# Patient Record
Sex: Female | Born: 1985 | Race: Black or African American | Hispanic: No | Marital: Married | Smoking: Never smoker
Health system: Southern US, Community
[De-identification: ages and names within clinical notes are randomized; demographics above are authoritative.]

## PROBLEM LIST (undated history)

## (undated) DIAGNOSIS — R51 Headache: Secondary | ICD-10-CM

## (undated) DIAGNOSIS — K219 Gastro-esophageal reflux disease without esophagitis: Secondary | ICD-10-CM

## (undated) DIAGNOSIS — A749 Chlamydial infection, unspecified: Secondary | ICD-10-CM

## (undated) HISTORY — PX: WISDOM TOOTH EXTRACTION: SHX21

---
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
## 2009-01-14 ENCOUNTER — Other Ambulatory Visit: Admission: RE | Admit: 2009-01-14 | Discharge: 2009-01-14 | Payer: Self-pay | Admitting: Family Medicine

## 2010-08-17 ENCOUNTER — Other Ambulatory Visit
Admission: RE | Admit: 2010-08-17 | Discharge: 2010-08-17 | Payer: Self-pay | Source: Home / Self Care | Admitting: Family Medicine

## 2012-08-20 NOTE — L&D Delivery Note (Addendum)
Delivery Note At 3:59 AM a viable and healthy female was delivered spontaneously, Left Occiput Anterior.  APGAR: 9, 9; weight 6 lbs 13 oz.   Placenta status: Intact, Spontaneous.  Cord: 3 vessels.  Uterus explored and cervix inspected.  Anesthesia: Epidural  Episiotomy: None Lacerations: None Est. Blood Loss (mL): <400  Mom to postpartum.  Baby to nursery-stable.  Emet Rafanan D 05/17/2013, 4:13 AM

## 2012-11-04 LAB — OB RESULTS CONSOLE HIV ANTIBODY (ROUTINE TESTING): HIV: NONREACTIVE

## 2012-11-04 LAB — OB RESULTS CONSOLE ANTIBODY SCREEN: Antibody Screen: NEGATIVE

## 2012-11-04 LAB — OB RESULTS CONSOLE HEPATITIS B SURFACE ANTIGEN: Hepatitis B Surface Ag: NEGATIVE

## 2012-11-04 LAB — OB RESULTS CONSOLE RPR: RPR: NONREACTIVE

## 2012-11-04 LAB — OB RESULTS CONSOLE ABO/RH: RH Type: POSITIVE

## 2012-11-04 LAB — OB RESULTS CONSOLE RUBELLA ANTIBODY, IGM: Rubella: IMMUNE

## 2013-04-16 LAB — OB RESULTS CONSOLE GC/CHLAMYDIA
Chlamydia: NEGATIVE
Gonorrhea: NEGATIVE

## 2013-04-16 LAB — OB RESULTS CONSOLE GBS
GBS: NEGATIVE
GBS: NEGATIVE

## 2013-05-16 ENCOUNTER — Inpatient Hospital Stay (HOSPITAL_COMMUNITY)
Admission: AD | Admit: 2013-05-16 | Discharge: 2013-05-16 | Disposition: A | Discharge status: Home / Self Care | Payer: Managed Care, Other (non HMO) | Source: Ambulatory Visit | Attending: Obstetrics & Gynecology | Admitting: Obstetrics & Gynecology

## 2013-05-16 ENCOUNTER — Encounter (HOSPITAL_COMMUNITY): Payer: Self-pay | Admitting: *Deleted

## 2013-05-16 ENCOUNTER — Encounter (HOSPITAL_COMMUNITY): Payer: Self-pay | Admitting: Anesthesiology

## 2013-05-16 ENCOUNTER — Inpatient Hospital Stay (HOSPITAL_COMMUNITY): Payer: Managed Care, Other (non HMO) | Admitting: Anesthesiology

## 2013-05-16 ENCOUNTER — Inpatient Hospital Stay (HOSPITAL_COMMUNITY)
Admission: AD | Admit: 2013-05-16 | Discharge: 2013-05-19 | DRG: 775 | Disposition: A | Discharge status: Home / Self Care | Payer: Managed Care, Other (non HMO) | Source: Ambulatory Visit | Attending: Obstetrics & Gynecology | Admitting: Obstetrics & Gynecology

## 2013-05-16 DIAGNOSIS — O479 False labor, unspecified: Secondary | ICD-10-CM | POA: Insufficient documentation

## 2013-05-16 HISTORY — DX: Headache: R51

## 2013-05-16 HISTORY — DX: Gastro-esophageal reflux disease without esophagitis: K21.9

## 2013-05-16 HISTORY — DX: Chlamydial infection, unspecified: A74.9

## 2013-05-16 LAB — CBC
HCT: 36.1 % (ref 36.0–46.0)
MCV: 81.1 fL (ref 78.0–100.0)
Platelets: 231 10*3/uL (ref 150–400)
RBC: 4.45 MIL/uL (ref 3.87–5.11)
RDW: 15 % (ref 11.5–15.5)
WBC: 9 10*3/uL (ref 4.0–10.5)

## 2013-05-16 MED ORDER — LACTATED RINGERS IV SOLN: 500.0000 mL | INTRAVENOUS | Status: DC | PRN

## 2013-05-16 MED ORDER — LIDOCAINE HCL (PF) 1 % IJ SOLN
INTRAMUSCULAR | Status: DC | PRN
  Administered 2013-05-16 (×3): 5 mL

## 2013-05-16 MED ORDER — PHENYLEPHRINE 40 MCG/ML (10ML) SYRINGE FOR IV PUSH (FOR BLOOD PRESSURE SUPPORT)
80.0000 ug | PREFILLED_SYRINGE | INTRAVENOUS | Status: DC | PRN
  Filled 2013-05-16: qty 2

## 2013-05-16 MED ORDER — FLEET ENEMA 7-19 GM/118ML RE ENEM: 1.0000 | ENEMA | RECTAL | Status: DC | PRN

## 2013-05-16 MED ORDER — EPHEDRINE 5 MG/ML INJ
10.0000 mg | INTRAVENOUS | Status: DC | PRN
  Filled 2013-05-16: qty 2
  Filled 2013-05-16: qty 4

## 2013-05-16 MED ORDER — OXYTOCIN 40 UNITS IN LACTATED RINGERS INFUSION - SIMPLE MED: 62.5000 mL/h | INTRAVENOUS | Status: DC

## 2013-05-16 MED ORDER — OXYCODONE-ACETAMINOPHEN 5-325 MG PO TABS: 1.0000 | ORAL_TABLET | ORAL | Status: DC | PRN

## 2013-05-16 MED ORDER — CITRIC ACID-SODIUM CITRATE 334-500 MG/5ML PO SOLN: 30.0000 mL | ORAL | Status: DC | PRN

## 2013-05-16 MED ORDER — OXYTOCIN 40 UNITS IN LACTATED RINGERS INFUSION - SIMPLE MED
1.0000 m[IU]/min | INTRAVENOUS | Status: DC
  Administered 2013-05-16: 2 m[IU]/min via INTRAVENOUS
  Filled 2013-05-16: qty 1000

## 2013-05-16 MED ORDER — FENTANYL 2.5 MCG/ML BUPIVACAINE 1/10 % EPIDURAL INFUSION (WH - ANES)
14.0000 mL/h | INTRAMUSCULAR | Status: DC | PRN
  Administered 2013-05-17: 14 mL/h via EPIDURAL
  Filled 2013-05-16 (×2): qty 125

## 2013-05-16 MED ORDER — IBUPROFEN 600 MG PO TABS
600.0000 mg | ORAL_TABLET | Freq: Four times a day (QID) | ORAL | Status: DC | PRN
  Administered 2013-05-17: 600 mg via ORAL
  Filled 2013-05-16: qty 1

## 2013-05-16 MED ORDER — ACETAMINOPHEN 325 MG PO TABS: 650.0000 mg | ORAL_TABLET | ORAL | Status: DC | PRN

## 2013-05-16 MED ORDER — FENTANYL 2.5 MCG/ML BUPIVACAINE 1/10 % EPIDURAL INFUSION (WH - ANES)
INTRAMUSCULAR | Status: DC | PRN
  Administered 2013-05-16: 14 mL/h via EPIDURAL

## 2013-05-16 MED ORDER — ONDANSETRON HCL 4 MG/2ML IJ SOLN
4.0000 mg | Freq: Four times a day (QID) | INTRAMUSCULAR | Status: DC | PRN
  Administered 2013-05-17: 4 mg via INTRAVENOUS
  Filled 2013-05-16: qty 2

## 2013-05-16 MED ORDER — PHENYLEPHRINE 40 MCG/ML (10ML) SYRINGE FOR IV PUSH (FOR BLOOD PRESSURE SUPPORT)
80.0000 ug | PREFILLED_SYRINGE | INTRAVENOUS | Status: DC | PRN
  Filled 2013-05-16: qty 2
  Filled 2013-05-16: qty 5

## 2013-05-16 MED ORDER — EPHEDRINE 5 MG/ML INJ
10.0000 mg | INTRAVENOUS | Status: DC | PRN
  Filled 2013-05-16: qty 2

## 2013-05-16 MED ORDER — LIDOCAINE HCL (PF) 1 % IJ SOLN
30.0000 mL | INTRAMUSCULAR | Status: DC | PRN
  Filled 2013-05-16 (×2): qty 30

## 2013-05-16 MED ORDER — TERBUTALINE SULFATE 1 MG/ML IJ SOLN: 0.2500 mg | Freq: Once | INTRAMUSCULAR | Status: AC | PRN

## 2013-05-16 MED ORDER — OXYTOCIN BOLUS FROM INFUSION
500.0000 mL | INTRAVENOUS | Status: DC
  Administered 2013-05-17: 500 mL via INTRAVENOUS

## 2013-05-16 MED ORDER — LACTATED RINGERS IV SOLN: INTRAVENOUS | Status: DC

## 2013-05-16 MED ORDER — LACTATED RINGERS IV SOLN
500.0000 mL | Freq: Once | INTRAVENOUS | Status: AC
  Administered 2013-05-16: 500 mL via INTRAVENOUS

## 2013-05-16 MED ORDER — DIPHENHYDRAMINE HCL 50 MG/ML IJ SOLN: 12.5000 mg | INTRAMUSCULAR | Status: DC | PRN

## 2013-05-16 NOTE — H&P (Signed)
27 y.o. G1P0  Estimated Date of Delivery: 05/17/13 admitted at 39/[redacted] weeks gestation in labor.  Prenatal Transfer Tool  Maternal Diabetes: No Genetic Screening: Normal Maternal Ultrasounds/Referrals: Normal Fetal Ultrasounds or other Referrals:  None Maternal Substance Abuse:  No Significant Maternal Medications:  None Significant Maternal Lab Results: None Other Significant Pregnancy Complications:  None  Afebrile, VSS Heart and Lungs: No active disease Abdomen: soft, gravid, EFW AGA. Cervical exam:  4/80  Impression: Early labor  Plan:  Admit for delivery

## 2013-05-16 NOTE — Anesthesia Procedure Notes (Signed)
Epidural Patient location during procedure: OB  Staffing Anesthesiologist: Tyheem Boughner Performed by: anesthesiologist   Preanesthetic Checklist Completed: patient identified, site marked, surgical consent, pre-op evaluation, timeout performed, IV checked, risks and benefits discussed and monitors and equipment checked  Epidural Patient position: sitting Prep: ChloraPrep Patient monitoring: heart rate, continuous pulse ox and blood pressure Approach: right paramedian Injection technique: LOR saline  Needle:  Needle type: Tuohy  Needle gauge: 17 G Needle length: 9 cm and 9 Needle insertion depth: 6 cm Catheter type: closed end flexible Catheter size: 20 Guage Catheter at skin depth: 12 cm Test dose: negative  Assessment Events: blood not aspirated, injection not painful, no injection resistance, negative IV test and no paresthesia  Additional Notes   Patient tolerated the insertion well without complications.   

## 2013-05-16 NOTE — MAU Note (Signed)
Contractions since 7pm every 4-6 minutes. Some vaginal discharge. Denies gushes of fluid and vaginal bleeding. Positive fetal movement.

## 2013-05-16 NOTE — Anesthesia Preprocedure Evaluation (Signed)

## 2013-05-17 ENCOUNTER — Encounter (HOSPITAL_COMMUNITY): Payer: Self-pay | Admitting: Obstetrics

## 2013-05-17 LAB — CBC
HCT: 28.9 % — ABNORMAL LOW (ref 36.0–46.0)
Hemoglobin: 9.8 g/dL — ABNORMAL LOW (ref 12.0–15.0)
MCH: 27.3 pg (ref 26.0–34.0)
MCV: 80.5 fL (ref 78.0–100.0)
Platelets: 182 10*3/uL (ref 150–400)
RDW: 15 % (ref 11.5–15.5)

## 2013-05-17 LAB — RPR: RPR Ser Ql: NONREACTIVE

## 2013-05-17 MED ORDER — DIBUCAINE 1 % RE OINT: 1.0000 "application " | TOPICAL_OINTMENT | RECTAL | Status: DC | PRN

## 2013-05-17 MED ORDER — SENNOSIDES-DOCUSATE SODIUM 8.6-50 MG PO TABS
2.0000 | ORAL_TABLET | Freq: Every day | ORAL | Status: DC
Start: 2013-05-18 — End: 2013-05-19
  Administered 2013-05-19: 2 via ORAL

## 2013-05-17 MED ORDER — ONDANSETRON HCL 4 MG PO TABS: 4.0000 mg | ORAL_TABLET | ORAL | Status: DC | PRN

## 2013-05-17 MED ORDER — INFLUENZA VAC SPLIT QUAD 0.5 ML IM SUSP
0.5000 mL | INTRAMUSCULAR | Status: AC
  Administered 2013-05-18: 0.5 mL via INTRAMUSCULAR
  Filled 2013-05-17: qty 0.5

## 2013-05-17 MED ORDER — ONDANSETRON HCL 4 MG/2ML IJ SOLN: 4.0000 mg | INTRAMUSCULAR | Status: DC | PRN

## 2013-05-17 MED ORDER — LANOLIN HYDROUS EX OINT: TOPICAL_OINTMENT | CUTANEOUS | Status: DC | PRN

## 2013-05-17 MED ORDER — BENZOCAINE-MENTHOL 20-0.5 % EX AERO: 1.0000 "application " | INHALATION_SPRAY | CUTANEOUS | Status: DC | PRN

## 2013-05-17 MED ORDER — DIPHENHYDRAMINE HCL 25 MG PO CAPS: 25.0000 mg | ORAL_CAPSULE | Freq: Four times a day (QID) | ORAL | Status: DC | PRN

## 2013-05-17 MED ORDER — PRENATAL MULTIVITAMIN CH
1.0000 | ORAL_TABLET | Freq: Every day | ORAL | Status: DC
  Administered 2013-05-17 – 2013-05-19 (×3): 1 via ORAL
  Filled 2013-05-17 (×3): qty 1

## 2013-05-17 MED ORDER — SIMETHICONE 80 MG PO CHEW: 80.0000 mg | CHEWABLE_TABLET | ORAL | Status: DC | PRN

## 2013-05-17 MED ORDER — ZOLPIDEM TARTRATE 5 MG PO TABS: 5.0000 mg | ORAL_TABLET | Freq: Every evening | ORAL | Status: DC | PRN

## 2013-05-17 MED ORDER — TETANUS-DIPHTH-ACELL PERTUSSIS 5-2.5-18.5 LF-MCG/0.5 IM SUSP
0.5000 mL | Freq: Once | INTRAMUSCULAR | Status: AC
  Administered 2013-05-18: 0.5 mL via INTRAMUSCULAR
  Filled 2013-05-17: qty 0.5

## 2013-05-17 MED ORDER — IBUPROFEN 600 MG PO TABS
600.0000 mg | ORAL_TABLET | Freq: Four times a day (QID) | ORAL | Status: DC
  Administered 2013-05-17 – 2013-05-19 (×9): 600 mg via ORAL
  Filled 2013-05-17 (×9): qty 1

## 2013-05-17 MED ORDER — WITCH HAZEL-GLYCERIN EX PADS: 1.0000 "application " | MEDICATED_PAD | CUTANEOUS | Status: DC | PRN

## 2013-05-17 MED ORDER — OXYCODONE-ACETAMINOPHEN 5-325 MG PO TABS: 1.0000 | ORAL_TABLET | ORAL | Status: DC | PRN

## 2013-05-17 NOTE — Anesthesia Postprocedure Evaluation (Signed)
Anesthesia Post Note  Patient: Angelica Rush  Procedure(s) Performed: * No procedures listed *  Anesthesia type: Epidural  Patient location: Mother/Baby  Post pain: Pain level controlled  Post assessment: Post-op Vital signs reviewed  Last Vitals:  Filed Vitals:   05/17/13 0816  BP: 111/52  Pulse: 97  Temp: 37.2 C  Resp: 18    Post vital signs: Reviewed  Level of consciousness:alert  Complications: No apparent anesthesia complications

## 2013-05-18 NOTE — Progress Notes (Signed)
PPD#1 Pt is doing well . Offered early discharge. VSSAF IMP/ stable PLAN/ routine care.

## 2013-05-19 NOTE — Discharge Summary (Signed)
Obstetric Discharge Summary Reason for Admission: onset of labor Prenatal Procedures: none Intrapartum Procedures: spontaneous vaginal delivery Postpartum Procedures: none Complications-Operative and Postpartum: none Hemoglobin  Date Value Range Status  05/17/2013 9.8* 12.0 - 15.0 g/dL Final     HCT  Date Value Range Status  05/17/2013 28.9* 36.0 - 46.0 % Final    Physical Exam:  General: alert and cooperative Lochia: appropriate Uterine Fundus: firm DVT Evaluation: No evidence of DVT seen on physical exam.  Discharge Diagnoses: Term Pregnancy-delivered  Discharge Information: Date: 05/19/2013 Activity: pelvic rest Diet: routine Medications: PNV and Ibuprofen Condition: stable Instructions: refer to practice specific booklet Discharge to: home Follow-up Information   Follow up with Mickel Baas, MD In 4 weeks.   Specialty:  Obstetrics and Gynecology   Contact information:   [address] [place] [address] [phone]       Newborn Data: Live born female  Birth Weight: 6 lb 13 oz (3090 g) APGAR: 9, 9  Home with mother.  Philip Aspen 05/19/2013, 10:05 AM

## 2014-06-21 ENCOUNTER — Encounter (HOSPITAL_COMMUNITY): Payer: Self-pay | Admitting: Obstetrics

## 2020-05-10 ENCOUNTER — Inpatient Hospital Stay (HOSPITAL_COMMUNITY)
Admission: AD | Admit: 2020-05-10 | Discharge: 2020-05-11 | Disposition: A | Discharge status: Home / Self Care | Payer: Federal, State, Local not specified - PPO | Attending: Family Medicine | Admitting: Family Medicine

## 2020-05-10 ENCOUNTER — Encounter (HOSPITAL_COMMUNITY): Payer: Self-pay | Admitting: Family Medicine

## 2020-05-10 ENCOUNTER — Other Ambulatory Visit: Payer: Self-pay

## 2020-05-10 DIAGNOSIS — O219 Vomiting of pregnancy, unspecified: Secondary | ICD-10-CM | POA: Diagnosis not present

## 2020-05-10 DIAGNOSIS — E860 Dehydration: Secondary | ICD-10-CM

## 2020-05-10 DIAGNOSIS — Z3A01 Less than 8 weeks gestation of pregnancy: Secondary | ICD-10-CM | POA: Diagnosis not present

## 2020-05-10 DIAGNOSIS — R112 Nausea with vomiting, unspecified: Secondary | ICD-10-CM | POA: Diagnosis not present

## 2020-05-10 DIAGNOSIS — K219 Gastro-esophageal reflux disease without esophagitis: Secondary | ICD-10-CM | POA: Insufficient documentation

## 2020-05-10 DIAGNOSIS — Z79899 Other long term (current) drug therapy: Secondary | ICD-10-CM | POA: Diagnosis not present

## 2020-05-10 DIAGNOSIS — Z7982 Long term (current) use of aspirin: Secondary | ICD-10-CM | POA: Insufficient documentation

## 2020-05-10 DIAGNOSIS — O99611 Diseases of the digestive system complicating pregnancy, first trimester: Secondary | ICD-10-CM | POA: Diagnosis not present

## 2020-05-10 DIAGNOSIS — O26891 Other specified pregnancy related conditions, first trimester: Secondary | ICD-10-CM | POA: Insufficient documentation

## 2020-05-10 LAB — COMPREHENSIVE METABOLIC PANEL
ALT: 16 U/L (ref 0–44)
AST: 24 U/L (ref 15–41)
Albumin: 4.2 g/dL (ref 3.5–5.0)
Alkaline Phosphatase: 41 U/L (ref 38–126)
Anion gap: 12 (ref 5–15)
BUN: 5 mg/dL — ABNORMAL LOW (ref 6–20)
CO2: 22 mmol/L (ref 22–32)
Calcium: 9.6 mg/dL (ref 8.9–10.3)
Chloride: 98 mmol/L (ref 98–111)
Creatinine, Ser: 0.71 mg/dL (ref 0.44–1.00)
GFR calc Af Amer: >60 mL/min (ref >60)
GFR calc non Af Amer: >60 mL/min (ref >60)
Glucose, Bld: 84 mg/dL (ref 70–99)
Potassium: 3.4 mmol/L — ABNORMAL LOW (ref 3.5–5.1)
Sodium: 132 mmol/L — ABNORMAL LOW (ref 135–145)
Total Bilirubin: 0.9 mg/dL (ref 0.3–1.2)
Total Protein: 7.9 g/dL (ref 6.5–8.1)

## 2020-05-10 LAB — CBC
HCT: 39.3 % (ref 36.0–46.0)
Hemoglobin: 12.1 g/dL (ref 12.0–15.0)
MCH: 23.5 pg — ABNORMAL LOW (ref 26.0–34.0)
MCHC: 30.8 g/dL (ref 30.0–36.0)
MCV: 76.5 fL — ABNORMAL LOW (ref 80.0–100.0)
Platelets: 421 10*3/uL — ABNORMAL HIGH (ref 150–400)
RBC: 5.14 MIL/uL — ABNORMAL HIGH (ref 3.87–5.11)
RDW: 16.7 % — ABNORMAL HIGH (ref 11.5–15.5)
WBC: 7.2 10*3/uL (ref 4.0–10.5)
nRBC: 0 % (ref 0.0–0.2)

## 2020-05-10 LAB — URINALYSIS, ROUTINE W REFLEX MICROSCOPIC
Bilirubin Urine: NEGATIVE
Glucose, UA: NEGATIVE mg/dL
Hgb urine dipstick: NEGATIVE
Ketones, ur: 80 mg/dL — AB
Nitrite: NEGATIVE
Protein, ur: NEGATIVE mg/dL
Specific Gravity, Urine: 1.02 (ref 1.005–1.030)
pH: 5 (ref 5.0–8.0)

## 2020-05-10 LAB — POCT PREGNANCY, URINE: Preg Test, Ur: POSITIVE — AB

## 2020-05-10 MED ORDER — DIPHENHYDRAMINE HCL 25 MG PO CAPS
50.0000 mg | ORAL_CAPSULE | Freq: Once | ORAL | Status: AC
  Administered 2020-05-10: 50 mg via ORAL
  Filled 2020-05-10: qty 2

## 2020-05-10 MED ORDER — SCOPOLAMINE 1 MG/3DAYS TD PT72: 1.0000 | MEDICATED_PATCH | TRANSDERMAL | 12 refills | Status: AC

## 2020-05-10 MED ORDER — M.V.I. ADULT IV INJ
INJECTION | Freq: Once | INTRAVENOUS | Status: AC
  Filled 2020-05-10: qty 1000

## 2020-05-10 MED ORDER — SCOPOLAMINE 1 MG/3DAYS TD PT72
1.0000 | MEDICATED_PATCH | TRANSDERMAL | Status: DC
Start: 2020-05-10 — End: 2020-05-11
  Administered 2020-05-10: 1.5 mg via TRANSDERMAL
  Filled 2020-05-10: qty 1

## 2020-05-10 MED ORDER — PROMETHAZINE HCL 25 MG/ML IJ SOLN
25.0000 mg | Freq: Once | INTRAVENOUS | Status: AC
  Administered 2020-05-10: 22:00:00 25 mg via INTRAVENOUS
  Filled 2020-05-10: qty 1

## 2020-05-10 NOTE — MAU Note (Signed)
PT SAYS VOMITING - X3 TODAY . CALLED [facility] TODAY - HAS AN APPOINTMENT ON Monday . THEY CALLED IN PHENERGAN - PT TOOK AND VOMITED.

## 2020-05-10 NOTE — MAU Provider Note (Addendum)
History     CSN: [id number]  Arrival date and time: 05/10/20 1805   Saw patient at 2050    Chief Complaint  Patient presents with   Emesis   Angelica Rush 34 y.o. [redacted]w[redacted]d  Has had several days of nausea and vomiting.  Has new OB appointment on Monday.  Office has called in promethazine PO but client keeps vomiting the medicine again.  Has tried vitamin B^ and Unisom but keeps vomiting it as well.  Only able to keep down 1 cup of fluids a couple of times a day and a couple of crackers.  Emesis  This is a recurrent problem. The problem has been unchanged. There has been no fever. Pertinent negatives include no abdominal pain, coughing, diarrhea or fever. Treatments tried: Diclegis, phenergan. The treatment provided no relief.    OB History    Gravida  4   Para  1   Term  1   Preterm      AB  2   Living  1     SAB      TAB  2   Ectopic      Multiple      Live Births  1           Past Medical History:  Diagnosis Date   Chlamydia    GERD (gastroesophageal reflux disease)    Headache(784.0)     Past Surgical History:  Procedure Laterality Date   WISDOM TOOTH EXTRACTION      Family History  Problem Relation Age of Onset   Hypertension Mother    Hypertension Father    Cancer Maternal Grandmother        Breast    Social History   Tobacco Use   Smoking status: Never Smoker   Smokeless tobacco: Never Used  Vaping Use   Vaping Use: Never used  Substance Use Topics   Alcohol use: No   Drug use: No    Allergies:  Allergies  Allergen Reactions   Latex Itching    Medications Prior to Admission  Medication Sig Dispense Refill Last Dose   Prenatal Vit-Fe Fumarate-FA (MULTIVITAMIN-PRENATAL) 27-0.8 MG TABS tablet Take 1 tablet by mouth daily at 12 noon.   Past Week at Unknown time   promethazine (PHENERGAN) 25 MG tablet Take 25 mg by mouth every 6 (six) hours as needed for nausea or vomiting.   05/10/2020 at 1200    butalbital-aspirin-caffeine-codeine (FIORINAL WITH CODEINE) 50-325-40-30 MG capsule Take 1 capsule by mouth every 4 (four) hours as needed for pain.      omeprazole (PRILOSEC) 20 MG capsule Take 20 mg by mouth daily as needed (heartburn).       Review of Systems  Constitutional: Negative for fever.  Respiratory: Negative for cough, shortness of breath and wheezing.   Gastrointestinal: Positive for nausea and vomiting. Negative for abdominal pain and diarrhea.  Genitourinary: Negative for dysuria, vaginal bleeding and vaginal discharge.   Physical Exam   Blood pressure 121/84, pulse 82, temperature 99.1 F (37.3 C), resp. rate 20, height 5\' 4"  (1.626 m), weight 92.8 kg, last menstrual period 03/17/2020, unknown if currently breastfeeding.  Physical Exam Vitals and nursing note reviewed.  Constitutional:      Appearance: She is well-developed.  HENT:     Head: Normocephalic.  Abdominal:     Palpations: Abdomen is soft.     Tenderness: There is no abdominal tenderness. There is no guarding or rebound.  Musculoskeletal:  General: Normal range of motion.     Cervical back: Neck supple.  Skin:    General: Skin is warm and dry.  Neurological:     Mental Status: She is alert and oriented to person, place, and time.     MAU Course  Procedures  MDM Will start Phenergan 25 mg in 1000 cc of LR and follow with 1000cc D5 LR with multivitamins. Discussed options for taking medication - Phenergan tablet in the vagina.  Wait 30 minutes and try PO liquids or soft solids.  Discussed starting slowly with liquids and advancing to more solid foods slowly.  Avoid any oils and fried foods. Client had a reaction to Phenergan infusion - feeling tingling all over and cannot be still - standing and constantly moving her feet and legs.  Tired but not sleepy.  Nausea resolved.  Offered Benadryl PO to help reverse this side effect.  Client agrees and benadryl ordered, however client vomited 40  minutes after taking Benadryl PO.   Getting bag of multivitamins and is tolerating that bag. After vomiting, Scopolamine patch in place, but still having some nausea - offered Zofran IV - reviewed caution about using Zofran in first trimester due to animal studies showing possible defects associated with use.  Explained that no human studies have shown defects, but client declines Zofran at this time. Will give a small dose of Benadryl IV to replace some of what was possibly lost by vomiting as client still continues to have dystonic reaction.  Able to lie in bed some but is again standing beside the bed to help control her jerky movements.   Assessment and Plan  Nausea and vomiting in pregnancy. Dystonic reaction to Phenergan   Angelica Rush 05/10/2020, 8:59 PM   0025 Care assumed by Angelica Rush, CNM After a second dose of IV Benadryl, dystonic movements improved Plan Discharge home Will send Rx for zofran for nausea.  Explained small risk of defects on animal studies which need to be balanced with need to be able to eat and drink. Will also add Vitamin B6 and scopolamine patch.  Angelica Rush, CNM

## 2020-05-11 MED ORDER — VITAMIN B-6 100 MG PO TABS: 100.0000 mg | ORAL_TABLET | Freq: Every day | ORAL | 1 refills | Status: AC

## 2020-05-11 MED ORDER — DIPHENHYDRAMINE HCL 50 MG/ML IJ SOLN
25.0000 mg | Freq: Once | INTRAMUSCULAR | Status: AC
Start: 2020-05-11 — End: 2020-05-11
  Administered 2020-05-11: 01:00:00 25 mg via INTRAVENOUS
  Filled 2020-05-11: qty 1

## 2020-05-11 MED ORDER — ONDANSETRON 4 MG PO TBDP: 4.0000 mg | ORAL_TABLET | Freq: Four times a day (QID) | ORAL | 0 refills | Status: DC | PRN

## 2020-05-11 NOTE — Discharge Instructions (Signed)
Hyperemesis Gravidarum Hyperemesis gravidarum is a severe form of nausea and vomiting that happens during pregnancy. Hyperemesis is worse than morning sickness. It may cause you to have nausea or vomiting all day for many days. It may keep you from eating and drinking enough food and liquids, which can lead to dehydration, malnutrition, and weight loss. Hyperemesis usually occurs during the first half (the first 20 weeks) of pregnancy. It often goes away once a woman is in her second half of pregnancy. However, sometimes hyperemesis continues through an entire pregnancy. What are the causes? The cause of this condition is not known. It may be related to changes in chemicals (hormones) in the body during pregnancy, such as the high level of pregnancy hormone (human chorionic gonadotropin) or the increase in the female sex hormone (estrogen). What are the signs or symptoms? Symptoms of this condition include:  Nausea that does not go away.  Vomiting that does not allow you to keep any food down.  Weight loss.  Body fluid loss (dehydration).  Having no desire to eat, or not liking food that you have previously enjoyed. How is this diagnosed? This condition may be diagnosed based on:  A physical exam.  Your medical history.  Your symptoms.  Blood tests.  Urine tests. How is this treated? This condition is managed by controlling symptoms. This may include:  Following an eating plan. This can help lessen nausea and vomiting.  Taking prescription medicines. An eating plan and medicines are often used together to help control symptoms. If medicines do not help relieve nausea and vomiting, you may need to receive fluids through an IV at the hospital. Follow these instructions at home: Eating and drinking   Avoid the following: ? Drinking fluids with meals. Try not to drink anything during the 30 minutes before and after your meals. ? Drinking more than 1 cup of fluid at a  time. ? Eating foods that trigger your symptoms. These may include spicy foods, coffee, high-fat foods, very sweet foods, and acidic foods. ? Skipping meals. Nausea can be more intense on an empty stomach. If you cannot tolerate food, do not force it. Try sucking on ice chips or other frozen items and make up for missed calories later. ? Lying down within 2 hours after eating. ? Being exposed to environmental triggers. These may include food smells, smoky rooms, closed spaces, rooms with strong smells, warm or humid places, overly loud and noisy rooms, and rooms with motion or flickering lights. Try eating meals in a well-ventilated area that is free of strong smells. ? Quick and sudden changes in your movement. ? Taking iron pills and multivitamins that contain iron. If you take prescription iron pills, do not stop taking them unless your health care provider approves. ? Preparing food. The smell of food can spoil your appetite or trigger nausea.  To help relieve your symptoms: ? Listen to your body. Everyone is different and has different preferences. Find what works best for you. ? Eat and drink slowly. ? Eat 5-6 small meals daily instead of 3 large meals. Eating small meals and snacks can help you avoid an empty stomach. ? In the morning, before getting out of bed, eat a couple of crackers to avoid moving around on an empty stomach. ? Try eating starchy foods as these are usually tolerated well. Examples include cereal, toast, bread, potatoes, pasta, rice, and pretzels. ? Include at least 1 serving of protein with your meals and snacks. Protein options include   lean meats, poultry, seafood, beans, nuts, nut butters, eggs, cheese, and yogurt. ? Try eating a protein-rich snack before bed. Examples of a protein-rick snack include cheese and crackers or a peanut butter sandwich made with 1 slice of whole-wheat bread and 1 tsp (5 g) of peanut butter. ? Eat or suck on things that have ginger in them.  It may help relieve nausea. Add  tsp ground ginger to hot tea or choose ginger tea. ? Try drinking 100% fruit juice or an electrolyte drink. An electrolyte drink contains sodium, potassium, and chloride. ? Drink fluids that are cold, clear, and carbonated or sour. Examples include lemonade, ginger ale, lemon-lime soda, ice water, and sparkling water. ? Brush your teeth or use a mouth rinse after meals. ? Talk with your health care provider about starting a supplement of vitamin B6. General instructions  Take over-the-counter and prescription medicines only as told by your health care provider.  Follow instructions from your health care provider about eating or drinking restrictions.  Continue to take your prenatal vitamins as told by your health care provider. If you are having trouble taking your prenatal vitamins, talk with your health care provider about different options.  Keep all follow-up and pre-birth (prenatal) visits as told by your health care provider. This is important. Contact a health care provider if:  You have pain in your abdomen.  You have a severe headache.  You have vision problems.  You are losing weight.  You feel weak or dizzy. Get help right away if:  You cannot drink fluids without vomiting.  You vomit blood.  You have constant nausea and vomiting.  You are very weak.  You faint.  You have a fever and your symptoms suddenly get worse. Summary  Hyperemesis gravidarum is a severe form of nausea and vomiting that happens during pregnancy.  Making some changes to your eating habits may help relieve nausea and vomiting.  This condition may be managed with medicine.  If medicines do not help relieve nausea and vomiting, you may need to receive fluids through an IV at the hospital. This information is not intended to replace advice given to you by your health care provider. Make sure you discuss any questions you have with your health care  provider. Document Revised: 08/26/2017 Document Reviewed: 04/04/2016 Elsevier Patient Education  2020 [employer]. Keep your appointment in the office. Try the scopolamine patch instead of using phenergan. Try to also take the vitamin B6 and Unisom at bedtime.  [facility] [employer] for [employer] at [facility]       Phone: [phone]  Center for [employer] at [place]   Phone: [phone]  Center for [employer] at [place]  Phone: [phone]  Center for [facility] at [employer]  Phone: [phone]  Center for [facility] at [place]  Phone: [phone]  [facility] at [facility]   Phone: [phone]  [facility]       Phone: [phone]  [facility]    Phone: [phone]   [facility]    Phone: [phone]  [facility]    Phone: [phone]  [facility]    Phone: [phone]  [facility] Department-Family Planning       Phone: [phone]   [facility] Health Department-Maternity  Phone: [phone]  [facility]    Phone: [phone]  [facility] [place]  Phone: [phone]  [facility]      Phone: [phone]  [facility]    Phone: [phone]

## 2023-03-18 ENCOUNTER — Ambulatory Visit
Admission: EM | Admit: 2023-03-18 | Discharge: 2023-03-18 | Disposition: A | Discharge status: Home / Self Care | Payer: Federal, State, Local not specified - PPO | Attending: Internal Medicine | Admitting: Internal Medicine

## 2023-03-18 DIAGNOSIS — Z1152 Encounter for screening for COVID-19: Secondary | ICD-10-CM | POA: Diagnosis not present

## 2023-03-18 DIAGNOSIS — R110 Nausea: Secondary | ICD-10-CM | POA: Diagnosis not present

## 2023-03-18 DIAGNOSIS — R059 Cough, unspecified: Secondary | ICD-10-CM | POA: Insufficient documentation

## 2023-03-18 DIAGNOSIS — B9789 Other viral agents as the cause of diseases classified elsewhere: Secondary | ICD-10-CM | POA: Insufficient documentation

## 2023-03-18 DIAGNOSIS — J069 Acute upper respiratory infection, unspecified: Secondary | ICD-10-CM | POA: Insufficient documentation

## 2023-03-18 MED ORDER — ONDANSETRON 4 MG PO TBDP
4.0000 mg | ORAL_TABLET | Freq: Once | ORAL | Status: AC
  Administered 2023-03-18: 4 mg via ORAL

## 2023-03-18 MED ORDER — ONDANSETRON 4 MG PO TBDP: 4.0000 mg | ORAL_TABLET | Freq: Three times a day (TID) | ORAL | 0 refills | Status: DC | PRN

## 2023-03-18 MED ORDER — BENZONATATE 100 MG PO CAPS: 100.0000 mg | ORAL_CAPSULE | Freq: Three times a day (TID) | ORAL | 0 refills | Status: AC

## 2023-03-18 MED ORDER — KETOROLAC TROMETHAMINE 30 MG/ML IJ SOLN
30.0000 mg | Freq: Once | INTRAMUSCULAR | Status: AC
  Administered 2023-03-18: 30 mg via INTRAMUSCULAR

## 2023-03-18 NOTE — ED Provider Notes (Signed)
[facility]    CSN: [id number] Arrival date & time: 03/18/23  1548      History   Chief Complaint Chief Complaint  Patient presents with   Cough    HPI Angelica Rush is a 37 y.o. female.   Patient presents to urgent care for evaluation of cough, body aches, fatigue, and headache that started 2 days ago on Saturday March 16, 2023. Cough is mostly dry and non-productive. Reports some bilateral chest discomfort associated with coughing, no chest pain at rest. Reports post-nasal drip sensation. Headache is generalized to the frontal aspect of the head and is currently 2/10, comes and goes. Reports intermittent nausea without vomiting, diarrhea, rash, abdominal pain, or dizziness. Never smoker, denies drug use.  Denies history of chronic respiratory problems. Using OTC dayquil/nyquil as needed for symptoms with temporary relief.   Cough   Past Medical History:  Diagnosis Date   Chlamydia    GERD (gastroesophageal reflux disease)    Headache(784.0)     There are no problems to display for this patient.   Past Surgical History:  Procedure Laterality Date   WISDOM TOOTH EXTRACTION      OB History     Gravida  4   Para  1   Term  1   Preterm      AB  2   Living  1      SAB      IAB  2   Ectopic      Multiple      Live Births  1            Home Medications    Prior to Admission medications   Medication Sig Start Date End Date Taking? Authorizing Provider  benzonatate (TESSALON) 100 MG capsule Take 1 capsule (100 mg total) by mouth every 8 (eight) hours. 03/18/23  Yes Etter Royall, Donavan Burnet, FNP  HAILEY 24 FE 1-20 MG-MCG(24) tablet Take 1 tablet by mouth daily. 03/07/23  Yes [provider]  ondansetron (ZOFRAN-ODT) 4 MG disintegrating tablet Take 1 tablet (4 mg total) by mouth every 8 (eight) hours as needed for nausea or vomiting. 03/18/23  Yes Carlisle Beers, FNP  omeprazole (PRILOSEC) 20 MG capsule Take 20 mg by mouth  daily as needed (heartburn).    [provider]  Prenatal Vit-Fe Fumarate-FA (MULTIVITAMIN-PRENATAL) 27-0.8 MG TABS tablet Take 1 tablet by mouth daily at 12 noon.    [provider]  pyridOXINE (VITAMIN B-6) 100 MG tablet Take 1 tablet (100 mg total) by mouth daily. 05/11/20   Aviva Signs, CNM  scopolamine (TRANSDERM-SCOP) 1 MG/3DAYS Place 1 patch (1.5 mg total) onto the skin every 3 (three) days. 05/10/20   Currie Paris, NP    Family History Family History  Problem Relation Age of Onset   Hypertension Mother    Hypertension Father    Cancer Maternal Grandmother        Breast    Social History Social History   Tobacco Use   Smoking status: Never   Smokeless tobacco: Never  Vaping Use   Vaping status: Never Used  Substance Use Topics   Alcohol use: Yes    Comment: occ   Drug use: Yes    Types: Marijuana    Comment: edibles     Allergies   Phenergan [promethazine] and Latex   Review of Systems Review of Systems  Respiratory:  Positive for cough.   Per HPI   Physical Exam Triage Vital  Signs ED Triage Vitals  Encounter Vitals Group     BP 03/18/23 1558 130/88     Systolic BP Percentile --      Diastolic BP Percentile --      Pulse Rate 03/18/23 1558 99     Resp 03/18/23 1558 18     Temp 03/18/23 1558 99.5 F (37.5 C)     Temp Source 03/18/23 1558 Oral     SpO2 03/18/23 1558 97 %     Weight --      Height --      Head Circumference --      Peak Flow --      Pain Score 03/18/23 1606 0     Pain Loc --      Pain Education --      Exclude from Growth Chart --    No data found.  Updated Vital Signs BP 130/88 (BP Location: Right Arm)   Pulse 99   Temp 99.5 F (37.5 C) (Oral)   Resp 18   SpO2 97%   Visual Acuity Right Eye Distance:   Left Eye Distance:   Bilateral Distance:    Right Eye Near:   Left Eye Near:    Bilateral Near:     Physical Exam Vitals and nursing note reviewed.  Constitutional:      Appearance: She  is obese. She is ill-appearing. She is not toxic-appearing.  HENT:     Head: Normocephalic and atraumatic.     Right Ear: Hearing, tympanic membrane, ear canal and external ear normal.     Left Ear: Hearing, tympanic membrane, ear canal and external ear normal.     Nose: Congestion present.     Mouth/Throat:     Lips: Pink.     Mouth: Mucous membranes are moist. No injury.     Tongue: No lesions. Tongue does not deviate from midline.     Palate: No mass and lesions.     Pharynx: Oropharynx is clear. Uvula midline. Posterior oropharyngeal erythema present. No pharyngeal swelling, oropharyngeal exudate or uvula swelling.     Tonsils: No tonsillar exudate or tonsillar abscesses.  Eyes:     General: Lids are normal. Vision grossly intact. Gaze aligned appropriately.     Extraocular Movements: Extraocular movements intact.     Conjunctiva/sclera: Conjunctivae normal.  Cardiovascular:     Rate and Rhythm: Normal rate and regular rhythm.     Heart sounds: Normal heart sounds, S1 normal and S2 normal.  Pulmonary:     Effort: Pulmonary effort is normal. No respiratory distress.     Breath sounds: Normal breath sounds and air entry. No wheezing, rhonchi or rales.  Chest:     Chest wall: No tenderness.  Musculoskeletal:     Cervical back: Neck supple.  Lymphadenopathy:     Cervical: No cervical adenopathy.  Skin:    General: Skin is warm and dry.     Capillary Refill: Capillary refill takes less than 2 seconds.     Findings: No rash.  Neurological:     General: No focal deficit present.     Mental Status: She is alert and oriented to person, place, and time. Mental status is at baseline.     Cranial Nerves: No dysarthria or facial asymmetry.  Psychiatric:        Mood and Affect: Mood normal.        Speech: Speech normal.        Behavior: Behavior normal.  Thought Content: Thought content normal.        Judgment: Judgment normal.      UC Treatments / Results  Labs (all labs  ordered are listed, but only abnormal results are displayed) Labs Reviewed  SARS CORONAVIRUS 2 (TAT 6-24 HRS)    EKG   Radiology No results found.  Procedures Procedures (including critical care time)  Medications Ordered in UC Medications  ondansetron (ZOFRAN-ODT) disintegrating tablet 4 mg (has no administration in time range)  ketorolac (TORADOL) 30 MG/ML injection 30 mg (has no administration in time range)    Initial Impression / Assessment and Plan / UC Course  I have reviewed the triage vital signs and the nursing notes.  Pertinent labs & imaging results that were available during my care of the patient were reviewed by me and considered in my medical decision making (see chart for details).   1. Viral URI with cough, nausea without vomiting Evaluation suggests viral URI etiology. Will manage this with recommendations for OTC and prescription medications for symptomatic relief. Encouraged to push fluids to stay well hydrated.  Imaging: deferred based on stable cardiopulmonary exam/hemodynamically stable vital signs  Viral testing: COVID-19 testing is pending, patient is a candidate for antiviral therapy and may have Paxlovid if test positive for COVID and within the 5-day window based on history of obesity. Last CMP 04/2020 shows creatinine 0.71 and GFR greater than 60.   Medications given in clinic: Ketorolac 30mg  IM, zofran 4mg  ODT (no NSAID for 24 hours)  Counseled patient on potential for adverse effects with medications prescribed/recommended today, strict ER and return-to-clinic precautions discussed, patient verbalized understanding.    Final Clinical Impressions(s) / UC Diagnoses   Final diagnoses:  Viral URI with cough  Nausea without vomiting     Discharge Instructions      Your symptoms are most likely due to a viral illness, which will improve on its own with rest and fluids.  - Take prescribed medicines to help with symptoms: tessalon perles for  cough and zofran for nausea - Use over the counter medicines to help with symptoms as discussed: Ibuprofen, tylenol, guaifenesin (mucinex), zyrtec, etc - Two teaspoons of honey in warm water every 4-6 hours may help with throat pains - Humidifier in your room at night to help add water the air and soothe cough  If you develop any new or worsening symptoms or do not improve in the next 2 to 3 days, please return.  If your symptoms are severe, please go to the emergency room.  Follow-up with PCP as needed.     ED Prescriptions     Medication Sig Dispense Auth. Provider   benzonatate (TESSALON) 100 MG capsule Take 1 capsule (100 mg total) by mouth every 8 (eight) hours. 21 capsule Reita May M, FNP   ondansetron (ZOFRAN-ODT) 4 MG disintegrating tablet Take 1 tablet (4 mg total) by mouth every 8 (eight) hours as needed for nausea or vomiting. 20 tablet Carlisle Beers, FNP      PDMP not reviewed this encounter.   Carlisle Beers, [place] 03/18/23 [phone]

## 2023-03-18 NOTE — ED Triage Notes (Signed)
Pt c/o prod cough, head/chest congestion, chest tightness- sx started 2 days ago after travel-NAD-steady gait

## 2023-03-18 NOTE — Discharge Instructions (Addendum)
Your symptoms are most likely due to a viral illness, which will improve on its own with rest and fluids.  - Take prescribed medicines to help with symptoms: tessalon perles for cough and zofran for nausea - Use over the counter medicines to help with symptoms as discussed: Ibuprofen, tylenol, guaifenesin (mucinex), zyrtec, etc - Two teaspoons of honey in warm water every 4-6 hours may help with throat pains - Humidifier in your room at night to help add water the air and soothe cough  If you develop any new or worsening symptoms or do not improve in the next 2 to 3 days, please return.  If your symptoms are severe, please go to the emergency room.  Follow-up with PCP as needed.

## 2024-07-07 ENCOUNTER — Other Ambulatory Visit: Payer: Self-pay

## 2024-07-07 ENCOUNTER — Encounter (HOSPITAL_COMMUNITY): Payer: Self-pay

## 2024-07-07 ENCOUNTER — Emergency Department (HOSPITAL_COMMUNITY)
Admission: EM | Admit: 2024-07-07 | Discharge: 2024-07-07 | Disposition: A | Discharge status: Home / Self Care | Attending: Emergency Medicine | Admitting: Emergency Medicine

## 2024-07-07 DIAGNOSIS — Z91040 Latex allergy status: Secondary | ICD-10-CM | POA: Insufficient documentation

## 2024-07-07 DIAGNOSIS — R112 Nausea with vomiting, unspecified: Secondary | ICD-10-CM | POA: Insufficient documentation

## 2024-07-07 LAB — CBC
HCT: 32.2 % — ABNORMAL LOW (ref 36.0–46.0)
Hemoglobin: 9.7 g/dL — ABNORMAL LOW (ref 12.0–15.0)
MCH: 22.6 pg — ABNORMAL LOW (ref 26.0–34.0)
MCHC: 30.1 g/dL (ref 30.0–36.0)
MCV: 74.9 fL — ABNORMAL LOW (ref 80.0–100.0)
Platelets: 406 K/uL — ABNORMAL HIGH (ref 150–400)
RBC: 4.3 MIL/uL (ref 3.87–5.11)
RDW: 20.4 % — ABNORMAL HIGH (ref 11.5–15.5)
WBC: 8.5 K/uL (ref 4.0–10.5)
nRBC: 0 % (ref 0.0–0.2)

## 2024-07-07 LAB — COMPREHENSIVE METABOLIC PANEL WITH GFR
ALT: 17 U/L (ref 0–44)
AST: 15 U/L (ref 15–41)
Albumin: 3.5 g/dL (ref 3.5–5.0)
Alkaline Phosphatase: 47 U/L (ref 38–126)
Anion gap: 10 (ref 5–15)
BUN: 10 mg/dL (ref 6–20)
CO2: 22 mmol/L (ref 22–32)
Calcium: 8.6 mg/dL — ABNORMAL LOW (ref 8.9–10.3)
Chloride: 104 mmol/L (ref 98–111)
Creatinine, Ser: 0.65 mg/dL (ref 0.44–1.00)
GFR, Estimated: >60 mL/min (ref >60)
Glucose, Bld: 114 mg/dL — ABNORMAL HIGH (ref 70–99)
Potassium: 4.1 mmol/L (ref 3.5–5.1)
Sodium: 136 mmol/L (ref 135–145)
Total Bilirubin: 0.4 mg/dL (ref 0.0–1.2)
Total Protein: 6.7 g/dL (ref 6.5–8.1)

## 2024-07-07 LAB — URINALYSIS, ROUTINE W REFLEX MICROSCOPIC
Bilirubin Urine: NEGATIVE
Glucose, UA: NEGATIVE mg/dL
Hgb urine dipstick: NEGATIVE
Ketones, ur: NEGATIVE mg/dL
Leukocytes,Ua: NEGATIVE
Nitrite: NEGATIVE
Protein, ur: NEGATIVE mg/dL
Specific Gravity, Urine: 1.024 (ref 1.005–1.030)
pH: 6 (ref 5.0–8.0)

## 2024-07-07 LAB — HCG, SERUM, QUALITATIVE: Preg, Serum: NEGATIVE

## 2024-07-07 LAB — LIPASE, BLOOD: Lipase: 33 U/L (ref 11–51)

## 2024-07-07 MED ORDER — ONDANSETRON 4 MG PO TBDP: 4.0000 mg | ORAL_TABLET | Freq: Three times a day (TID) | ORAL | 0 refills | Status: AC | PRN

## 2024-07-07 MED ORDER — ONDANSETRON 4 MG PO TBDP
4.0000 mg | ORAL_TABLET | Freq: Once | ORAL | Status: AC
  Administered 2024-07-07: 4 mg via ORAL
  Filled 2024-07-07: qty 1

## 2024-07-07 NOTE — ED Triage Notes (Signed)
 Pt arrived from home via POV c/o abd cramping 3/10 with emesis that began at 0100. Pt states that she had x 1 emesis episode. Pt states that it was brown in color and was worried that she might be bleeding internally.

## 2024-07-07 NOTE — ED Notes (Signed)
Pt tolerating po fluids well

## 2024-07-07 NOTE — ED Provider Notes (Signed)
 [place] EMERGENCY DEPARTMENT AT [facility] Provider Note   CSN: [id number] Arrival date & time: 07/07/24  0255     Patient presents with: Emesis and Abdominal Pain   MASYN FULLAM is a 38 y.o. female.    Emesis Associated symptoms: abdominal pain   Abdominal Pain Associated symptoms: vomiting    Patient is a 38 year old female with no pertinent past medical history presented emergency room today with complaints of 1 episode of emesis that occurred this morning patient states that she has a nausea is worse in general she denies any abdominal pain or vomiting since this occurred.  She denies any vaginal bleeding no chest pain difficulty breathing no headache or head injury.      Prior to Admission medications   Medication Sig Start Date End Date Taking? Authorizing Provider  ondansetron  (ZOFRAN -ODT) 4 MG disintegrating tablet Take 1 tablet (4 mg total) by mouth every 8 (eight) hours as needed for nausea or vomiting. 07/07/24  Yes Alizey Noren S, PA  benzonatate  (TESSALON ) 100 MG capsule Take 1 capsule (100 mg total) by mouth every 8 (eight) hours. 03/18/23   Enedelia Dorna HERO, FNP  HAILEY 24 FE 1-20 MG-MCG(24) tablet Take 1 tablet by mouth daily. 03/07/23   [provider]  omeprazole (PRILOSEC) 20 MG capsule Take 20 mg by mouth daily as needed (heartburn).    [provider]  Prenatal Vit-Fe Fumarate-FA (MULTIVITAMIN-PRENATAL) 27-0.8 MG TABS tablet Take 1 tablet by mouth daily at 12 noon.    [provider]  pyridOXINE (VITAMIN B-6) 100 MG tablet Take 1 tablet (100 mg total) by mouth daily. 05/11/20   Trudy Earnie CROME, CNM  scopolamine  (TRANSDERM-SCOP) 1 MG/3DAYS Place 1 patch (1.5 mg total) onto the skin every 3 (three) days. 05/10/20   Sid Veva CROME, NP    Allergies: Phenergan  [promethazine ] and Latex    Review of Systems  Gastrointestinal:  Positive for abdominal pain and vomiting.    Updated Vital Signs BP 119/77 (BP  Location: Left Arm)   Pulse 99   Temp 98 F (36.7 C)   Resp 18   Ht 5' 3.5 (1.613 m)   Wt 104.3 kg   LMP 06/16/2024 (Approximate)   SpO2 100%   BMI 40.10 kg/m   Physical Exam Vitals and nursing note reviewed.  Constitutional:      General: She is not in acute distress. HENT:     Head: Normocephalic and atraumatic.     Nose: Nose normal.  Eyes:     General: No scleral icterus. Cardiovascular:     Rate and Rhythm: Normal rate and regular rhythm.     Pulses: Normal pulses.     Heart sounds: Normal heart sounds.  Pulmonary:     Effort: Pulmonary effort is normal. No respiratory distress.     Breath sounds: No wheezing.  Abdominal:     Palpations: Abdomen is soft.     Tenderness: There is no abdominal tenderness.  Musculoskeletal:     Cervical back: Normal range of motion.     Right lower leg: No edema.     Left lower leg: No edema.  Skin:    General: Skin is warm and dry.     Capillary Refill: Capillary refill takes less than 2 seconds.  Neurological:     Mental Status: She is alert. Mental status is at baseline.  Psychiatric:        Mood and Affect: Mood normal.  Behavior: Behavior normal.     (all labs ordered are listed, but only abnormal results are displayed) Labs Reviewed  COMPREHENSIVE METABOLIC PANEL WITH GFR - Abnormal; Notable for the following components:      Result Value   Glucose, Bld 114 (*)    Calcium 8.6 (*)    All other components within normal limits  CBC - Abnormal; Notable for the following components:   Hemoglobin 9.7 (*)    HCT 32.2 (*)    MCV 74.9 (*)    MCH 22.6 (*)    RDW 20.4 (*)    Platelets 406 (*)    All other components within normal limits  LIPASE, BLOOD  HCG, SERUM, QUALITATIVE  URINALYSIS, ROUTINE W REFLEX MICROSCOPIC    EKG: None  Radiology: No results found.   Procedures   Medications Ordered in the ED  ondansetron  (ZOFRAN -ODT) disintegrating tablet 4 mg (4 mg Oral Given 07/07/24 1035)                                     Medical Decision Making Amount and/or Complexity of Data Reviewed Labs: ordered.  Risk Prescription drug management.   Patient is a 38 year old female with no pertinent past medical history presented emergency room today with complaints of 1 episode of emesis that occurred this morning patient states that she has a nausea is worse in general she denies any abdominal pain or vomiting since this occurred.  She denies any vaginal bleeding no chest pain difficulty breathing no headache or head injury.  Abdomen soft nontender no guarding or rebound.  Labs overall reassuring  Urine unremarkable  Pregnancy test negative lipase normal.  Patient is anemic has history of same not feeling lightheaded or dizzy or short of breath.  I am reassured by the fact that patient is somewhat nauseous frequently does vomit occasionally and had no concerning symptoms today no associated with nausea other than somewhat brownish appearance of vomit.  She was concerned this was blood however I have a low suspicion for this.  She has no history of esophageal varices she is not a heavy drinker no history of liver disease.  Will recommend follow-up with primary care and return to the emergency room as needed.  She is tolerating p.o. Will discharge home   Final diagnoses:  Nausea and vomiting, unspecified vomiting type    ED Discharge Orders          Ordered    ondansetron  (ZOFRAN -ODT) 4 MG disintegrating tablet  Every 8 hours PRN        07/07/24 1110               Neldon Inoue [place], [place] 07/07/24 1144    Patsey Lot, MD 07/07/24 1505

## 2024-07-07 NOTE — Discharge Instructions (Signed)
 I would like you to establish care with a primary care doctor.  I have prescribed you some Zofran  for nausea as needed make sure you are drinking plenty of water I recommend bland diet such as bananas rice applesauce toast until your nausea resolves completely.  Your lab work today is reassuring.  Return to the emergency room as needed

## 2024-09-02 ENCOUNTER — Encounter (HOSPITAL_COMMUNITY): Payer: Self-pay | Admitting: *Deleted

## 2024-09-02 ENCOUNTER — Other Ambulatory Visit: Payer: Self-pay

## 2024-09-02 ENCOUNTER — Emergency Department (HOSPITAL_COMMUNITY)
Admission: EM | Admit: 2024-09-02 | Discharge: 2024-09-03 | Disposition: A | Discharge status: Home / Self Care | Attending: Emergency Medicine | Admitting: Emergency Medicine

## 2024-09-02 DIAGNOSIS — R1111 Vomiting without nausea: Secondary | ICD-10-CM | POA: Diagnosis not present

## 2024-09-02 DIAGNOSIS — R109 Unspecified abdominal pain: Secondary | ICD-10-CM | POA: Diagnosis present

## 2024-09-02 LAB — URINALYSIS, ROUTINE W REFLEX MICROSCOPIC
Bilirubin Urine: NEGATIVE
Glucose, UA: NEGATIVE mg/dL
Hgb urine dipstick: NEGATIVE
Ketones, ur: 5 mg/dL — AB
Nitrite: NEGATIVE
Protein, ur: 30 mg/dL — AB
Specific Gravity, Urine: 1.031 — ABNORMAL HIGH (ref 1.005–1.030)
pH: 6 (ref 5.0–8.0)

## 2024-09-02 LAB — CBC WITH DIFFERENTIAL/PLATELET
Abs Immature Granulocytes: 0.03 K/uL (ref 0.00–0.07)
Basophils Absolute: 0 K/uL (ref 0.0–0.1)
Basophils Relative: 0 %
Eosinophils Absolute: 0 K/uL (ref 0.0–0.5)
Eosinophils Relative: 1 %
HCT: 30 % — ABNORMAL LOW (ref 36.0–46.0)
Hemoglobin: 8.9 g/dL — ABNORMAL LOW (ref 12.0–15.0)
Immature Granulocytes: 0 %
Lymphocytes Relative: 21 %
Lymphs Abs: 1.5 K/uL (ref 0.7–4.0)
MCH: 21.1 pg — ABNORMAL LOW (ref 26.0–34.0)
MCHC: 29.7 g/dL — ABNORMAL LOW (ref 30.0–36.0)
MCV: 71.1 fL — ABNORMAL LOW (ref 80.0–100.0)
Monocytes Absolute: 0.5 K/uL (ref 0.1–1.0)
Monocytes Relative: 6 %
Neutro Abs: 5.1 K/uL (ref 1.7–7.7)
Neutrophils Relative %: 72 %
Platelets: 385 K/uL (ref 150–400)
RBC: 4.22 MIL/uL (ref 3.87–5.11)
RDW: 17.3 % — ABNORMAL HIGH (ref 11.5–15.5)
WBC: 7.2 K/uL (ref 4.0–10.5)
nRBC: 0 % (ref 0.0–0.2)

## 2024-09-02 LAB — LIPASE, BLOOD: Lipase: 29 U/L (ref 11–51)

## 2024-09-02 LAB — COMPREHENSIVE METABOLIC PANEL WITH GFR
ALT: 14 U/L (ref 0–44)
AST: 13 U/L — ABNORMAL LOW (ref 15–41)
Albumin: 4.3 g/dL (ref 3.5–5.0)
Alkaline Phosphatase: 57 U/L (ref 38–126)
Anion gap: 11 (ref 5–15)
BUN: 10 mg/dL (ref 6–20)
CO2: 24 mmol/L (ref 22–32)
Calcium: 9.2 mg/dL (ref 8.9–10.3)
Chloride: 102 mmol/L (ref 98–111)
Creatinine, Ser: 0.66 mg/dL (ref 0.44–1.00)
GFR, Estimated: >60 mL/min
Glucose, Bld: 94 mg/dL (ref 70–99)
Potassium: 3.9 mmol/L (ref 3.5–5.1)
Sodium: 138 mmol/L (ref 135–145)
Total Bilirubin: 0.5 mg/dL (ref 0.0–1.2)
Total Protein: 7.4 g/dL (ref 6.5–8.1)

## 2024-09-02 LAB — HCG, SERUM, QUALITATIVE: Preg, Serum: NEGATIVE

## 2024-09-02 LAB — RESP PANEL BY RT-PCR (RSV, FLU A&B, COVID)  RVPGX2
Influenza A by PCR: NEGATIVE
Influenza B by PCR: NEGATIVE
Resp Syncytial Virus by PCR: NEGATIVE
SARS Coronavirus 2 by RT PCR: NEGATIVE

## 2024-09-02 MED ORDER — LACTATED RINGERS IV BOLUS
1000.0000 mL | Freq: Once | INTRAVENOUS | Status: AC
  Administered 2024-09-03: 1000 mL via INTRAVENOUS

## 2024-09-02 MED ORDER — DIPHENHYDRAMINE HCL 50 MG/ML IJ SOLN
25.0000 mg | Freq: Once | INTRAMUSCULAR | Status: AC
  Administered 2024-09-02: 25 mg via INTRAVENOUS
  Filled 2024-09-02: qty 1

## 2024-09-02 MED ORDER — SODIUM CHLORIDE 0.9 % IV BOLUS: 1000.0000 mL | Freq: Once | INTRAVENOUS | Status: DC

## 2024-09-02 MED ORDER — METOCLOPRAMIDE HCL 5 MG/ML IJ SOLN
10.0000 mg | Freq: Once | INTRAMUSCULAR | Status: AC
  Administered 2024-09-02: 10 mg via INTRAVENOUS
  Filled 2024-09-02: qty 2

## 2024-09-02 NOTE — ED Provider Triage Note (Signed)
 Emergency Medicine Provider Triage Evaluation Note  Angelica Rush , a 39 y.o. female  was evaluated in triage.  Pt complains of abdominal pain and nausea/vomiting that began earlier today.  Seen in urgent care and sent to ED for low blood pressure.  Has not been able to eat today.  Notes last menstrual cycle 1 month prior, denies concerns for pregnancy.  No previous abdominal surgeries.  Review of Systems  Positive: Nausea/vomiting, abdominal pain Negative: Fevers, diarrhea  Physical Exam  BP 107/73 (BP Location: Left Arm)   Pulse 93   Temp 98 F (36.7 C)   Resp 14   Ht 5' 5 (1.651 m)   Wt 104.3 kg   SpO2 100%   BMI 38.26 kg/m  Gen:   Awake, no distress   Resp:  Normal effort  MSK:   Moves extremities without difficulty  Other:    Medical Decision Making  Medically screening exam initiated at 8:37 PM.  Appropriate orders placed.  Yuritzy Zehring Badeaux was informed that the remainder of the evaluation will be completed by another provider, this initial triage assessment does not replace that evaluation, and the importance of remaining in the ED until their evaluation is complete.     Neysa Thersia RAMAN, [place] 09/02/24 2038

## 2024-09-02 NOTE — ED Triage Notes (Signed)
 Pt has been having nausea and vomiting all day today. Was seen at urgent care and sent for low blood pressure.

## 2024-09-02 NOTE — ED Provider Notes (Signed)
 " [place] EMERGENCY DEPARTMENT AT [facility] Provider Note   CSN: [id number] Arrival date & time: 09/02/24  1946     History Chief Complaint  Patient presents with   Abdominal Pain   Fatigue    HPI Angelica Rush is a 39 y.o. female presenting for chief complaint of nausea and vomitting. Family states substantial nausea and vomitting for the past 24 hours. Recently started ozempic about a month ago Last meal around noon with emesis NBNB emesis x4  Patient's recorded medical, surgical, social, medication list and allergies were reviewed in the Snapshot window as part of the initial history.   Review of Systems   Review of Systems  Constitutional:  Negative for chills and fever.  HENT:  Negative for ear pain and sore throat.   Eyes:  Negative for pain and visual disturbance.  Respiratory:  Negative for cough and shortness of breath.   Cardiovascular:  Negative for chest pain and palpitations.  Gastrointestinal:  Positive for abdominal pain, nausea and vomiting.  Genitourinary:  Negative for dysuria and hematuria.  Musculoskeletal:  Negative for arthralgias and back pain.  Skin:  Negative for color change and rash.  Neurological:  Negative for seizures and syncope.  All other systems reviewed and are negative.   Physical Exam Updated Vital Signs BP 97/61   Pulse 82   Temp 97.9 F (36.6 C) (Oral)   Resp 12   Ht 5' 5 (1.651 m)   Wt 104.3 kg   SpO2 99%   BMI 38.26 kg/m  Physical Exam Vitals and nursing note reviewed.  Constitutional:      General: She is not in acute distress.    Appearance: She is well-developed.  HENT:     Head: Normocephalic and atraumatic.  Eyes:     Conjunctiva/sclera: Conjunctivae normal.  Cardiovascular:     Rate and Rhythm: Normal rate and regular rhythm.     Heart sounds: No murmur heard. Pulmonary:     Effort: Pulmonary effort is normal. No respiratory distress.     Breath sounds: Normal breath sounds.  Abdominal:      Palpations: Abdomen is soft.     Tenderness: There is abdominal tenderness. There is no guarding.  Musculoskeletal:        General: No swelling.     Cervical back: Neck supple.  Skin:    General: Skin is warm and dry.     Capillary Refill: Capillary refill takes less than 2 seconds.  Neurological:     Mental Status: She is alert.  Psychiatric:        Mood and Affect: Mood normal.      ED Course/ Medical Decision Making/ A&P    Procedures Procedures   Medications Ordered in ED Medications  lactated ringers  bolus 1,000 mL (0 mLs Intravenous Stopped 09/03/24 0337)  metoCLOPramide  (REGLAN ) injection 10 mg (10 mg Intravenous Given 09/02/24 2354)  diphenhydrAMINE  (BENADRYL ) injection 25 mg (25 mg Intravenous Given 09/02/24 2357)  lactated ringers  bolus 1,000 mL (0 mLs Intravenous Stopped 09/03/24 9662)   Medical Decision Making:   [name] is a 39 y.o. female who presented to the ED today with abdominal pain and nausea/vomitting detailed above.    Additional history discussed with patient's family/caregivers.  Patient placed on continuous vitals and telemetry monitoring while in ED which was reviewed periodically.  Complete initial physical exam performed, notably the patient  was with severe, violent vomiting. Otherwise hemodynamically stable. Notably, nonbilious/nonbloody emesis. Reviewed and confirmed nursing  documentation for past medical history, family history, social history.    Initial Assessment:   With the patient's presentation of severe vomiting, most likely diagnosis is cyclic vomiting syndrome. Other diagnoses were considered including (but not limited to) appendicitis, cholecystitis, small bowel obstruction, Boerhaave syndrome/Mallory-Weiss tears. These are considered less likely due to history of present illness and physical exam findings.   This is most consistent with an acute life/limb threatening illness complicated by underlying chronic conditions. This  may or may not be complicated by metabolic derangements given severity of the emesis. Most likely causes of patient's cyclic vomiting syndrome include: Medication related Initial Plan:  Aggressive treatment of nausea vomiting.   Primarily will proceed with IV fluids and ondansetron . Primary therapy failed, will escalate with IV Reglan  and IV Benadryl  Screening labs including CBC and Metabolic panel to evaluate for infectious or metabolic etiology of disease.  Urinalysis with reflex culture ordered to evaluate for UTI or relevant urologic/nephrologic pathology.  Lipase to evaluate for pancreatitis CXR to evaluate for structural/infectious intrathoracic pathology and evaluation for free air in the setting of emesis.  I have considered further evaluation of patient's abdominal pain with cross-sectional imaging. Patient's pain is only present around episodes of nausea and vomiting.  They are not grossly peritonitic and the above severe pathologies are grossly favored less likely. Ultimately, patient's symptoms will be evaluated after medication therapy as above and if intractable symptoms will again reconsider imaging.  If symptoms have resolved, pretest probability would remain very low and below the risks of radiation exposure. Objective evaluation as below reviewed   Initial Study Results:   Laboratory  All laboratory results reviewed without evidence of clinically relevant pathology.    Reassessment and Plan:   Patient had complete resolution of their nausea vomiting and have been able to tolerate p.o. liquids at this time.  No metabolic crisis warranting more aggressive intervention at this time.  Will continue treatment with outpatient antiemetics and recommend follow-up with their primary care provider.  Disposition:  I have considered need for hospitalization, however, considering all of the above, I believe this patient is stable for discharge at this time.  Patient/family educated  about specific return precautions for given chief complaint and symptoms.  Patient/family educated about follow-up with PCP.     Patient/family expressed understanding of return precautions and need for follow-up. Patient spoken to regarding all imaging and laboratory results and appropriate follow up for these results. All education provided in verbal form with additional information in written form. Time was allowed for answering of patient questions. Patient discharged.    Emergency Department Medication Summary:   Medications  lactated ringers  bolus 1,000 mL (0 mLs Intravenous Stopped 09/03/24 0337)  metoCLOPramide  (REGLAN ) injection 10 mg (10 mg Intravenous Given 09/02/24 2354)  diphenhydrAMINE  (BENADRYL ) injection 25 mg (25 mg Intravenous Given 09/02/24 2357)  lactated ringers  bolus 1,000 mL (0 mLs Intravenous Stopped 09/03/24 0337)         Clinical Impression:  1. Abdominal pain, unspecified abdominal location   2. Vomiting without nausea, unspecified vomiting type      Discharge   Final Clinical Impression(s) / ED Diagnoses Final diagnoses:  Abdominal pain, unspecified abdominal location  Vomiting without nausea, unspecified vomiting type    Rx / DC Orders ED Discharge Orders          Ordered    metoCLOPramide  (REGLAN ) 10 MG tablet  Every 6 hours        09/03/24 0420  Jerral Meth, MD 09/03/24 [phone]  "

## 2024-09-03 MED ORDER — METOCLOPRAMIDE HCL 10 MG PO TABS: 10.0000 mg | ORAL_TABLET | Freq: Four times a day (QID) | ORAL | 0 refills | Status: AC

## 2024-09-03 NOTE — ED Notes (Signed)
 Pt sipping on water without difficulty
